# Patient Record
Sex: Female | Born: 2000 | Race: Black or African American | Hispanic: No | Marital: Single | Smoking: Never smoker
Health system: Southern US, Community
[De-identification: ages and names within clinical notes are randomized; demographics above are authoritative.]

## PROBLEM LIST (undated history)

## (undated) DIAGNOSIS — L309 Dermatitis, unspecified: Secondary | ICD-10-CM

## (undated) DIAGNOSIS — W3400XA Accidental discharge from unspecified firearms or gun, initial encounter: Secondary | ICD-10-CM

## (undated) DIAGNOSIS — Z789 Other specified health status: Secondary | ICD-10-CM

## (undated) HISTORY — DX: Dermatitis, unspecified: L30.9

## (undated) HISTORY — DX: Other specified health status: Z78.9

## (undated) HISTORY — PX: OTHER SURGICAL HISTORY: SHX169

## (undated) HISTORY — PX: NO PAST SURGERIES: SHX2092

---
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
## 2021-11-20 ENCOUNTER — Emergency Department
Admission: EM | Admit: 2021-11-20 | Discharge: 2021-11-20 | Disposition: A | Discharge status: Home / Self Care | Payer: Medicaid Other | Attending: Emergency Medicine | Admitting: Emergency Medicine

## 2021-11-20 ENCOUNTER — Other Ambulatory Visit: Payer: Self-pay

## 2021-11-20 ENCOUNTER — Encounter: Payer: Self-pay | Admitting: Emergency Medicine

## 2021-11-20 DIAGNOSIS — R220 Localized swelling, mass and lump, head: Secondary | ICD-10-CM | POA: Diagnosis not present

## 2021-11-20 MED ORDER — DIPHENHYDRAMINE HCL 50 MG PO TABS: 50.0000 mg | ORAL_TABLET | Freq: Three times a day (TID) | ORAL | 0 refills | Status: DC | PRN

## 2021-11-20 MED ORDER — LIDOCAINE-EPINEPHRINE 2 %-1:100000 IJ SOLN: 1.7000 mL | Freq: Once | INTRAMUSCULAR | Status: DC

## 2021-11-20 MED ORDER — LIDOCAINE-EPINEPHRINE 2 %-1:100000 IJ SOLN: 3.4000 mL | Freq: Once | INTRAMUSCULAR | Status: DC

## 2021-11-20 MED ORDER — PREDNISONE 50 MG PO TABS: 50.0000 mg | ORAL_TABLET | Freq: Every day | ORAL | 0 refills | Status: AC

## 2021-11-20 MED ORDER — LIDOCAINE VISCOUS HCL 2 % MT SOLN
15.0000 mL | Freq: Once | OROMUCOSAL | Status: DC
  Filled 2021-11-20: qty 15

## 2021-11-20 NOTE — ED Triage Notes (Signed)
Pt reports got her tongue pierced 7 months ago and was fine until 3 days ago and she ate something and now it is swollen and painful and she wants it removed.

## 2021-11-20 NOTE — ED Provider Notes (Signed)
[facility] Provider Note    None    (approximate)   History   Chief Complaint Foreign Body   HPI Rebekah Gray is a 21 y.o. female, no significant medical history, presents emergency department for evaluation of tongue swelling.  Patient states that she ate a new kind of Cheetos 3 days ago and since then has had swelling in her tongue.  She has a tongue piercing in place and is concerned that swelling will cause the piercing to get lost in the tissue.  She is requesting that the piercing be removed, as she cannot do it herself.  Denies fever/chills, chest pain, shortness of breath, dysphagia, abdominal pain, flank pain, nausea/vomiting, dizziness/lightheadedness, rash/lesions, or numb/tingling upper or lower extremities.  History Limitations: No limitations.        Physical Exam  Triage Vital Signs: ED Triage Vitals [11/20/21 1523]  Enc Vitals Group     BP      Pulse      Resp      Temp      Temp src      SpO2      Weight 128 lb 4.9 oz (58.2 kg)     Height 5\' 2"  (1.575 m)     Head Circumference      Peak Flow      Pain Score 10     Pain Loc      Pain Edu?      Excl. in [place]?     Most recent vital signs: There were no vitals filed for this visit.  General: Awake, NAD.  Skin: Warm, dry. No rashes or lesions.  Eyes: PERRL. Conjunctivae normal.  CV: Good peripheral perfusion.  Resp: Normal effort.  Abd: Soft, non-tender. No distention.  Neuro: At baseline. No gross neurological deficits.  Musculoskeletal: Normal ROM of all extremities.  Focused Exam: Piercing in place along the distal aspect of the tongue.  Mild swelling along the tongue, but otherwise no oropharyngeal edema or angioedema present.  No significant erythema.  No discharge.  Physical Exam    ED Results / Procedures / Treatments  Labs (all labs ordered are listed, but only abnormal results are displayed) Labs Reviewed - No data to  display   EKG N/A.    RADIOLOGY  ED Provider Interpretation: N/A.  No results found.  PROCEDURES:  Critical Care performed: N/A.  Procedures    MEDICATIONS ORDERED IN ED: Medications  lidocaine (XYLOCAINE) 2 % viscous mouth solution 15 mL (has no administration in time range)  lidocaine-EPINEPHrine (XYLOCAINE W/EPI) 2 %-1:100000 (with pres) injection 1.7 mL (has no administration in time range)     IMPRESSION / MDM / ASSESSMENT AND PLAN / ED COURSE  I reviewed the triage vital signs and the nursing notes.                              Differential diagnosis includes, but is not limited to, allergic reaction, angioedema, tongue swelling, glossitis, foreign body.  Assessment/Plan Patient presents with tongue swelling and request for removal of piercing.  Attempted manual removal of the piercing, however patient was not tolerating the procedure well due to pain/anxiety.  Trialed topical lidocaine along the tongue as well, however patient was still able to tolerate removal.  Offered to do inferior alveolar injections to provide anesthesia of the so she was tolerated the removal, however patient states that she is uncomfortable with the idea of  having injections.  She does state that her swelling has overall improved over the past 48 hours and believes that her tongue is shrinking gradually.  If that is the case, I do not see reason to remove the piercing at this time.  We will provide her with prescriptions for diphenhydramine and prednisone to treat this suspected allergic reaction.  Advised her to return to the emergency department if she begins to sign any signs of infection, including significant erythema, purulent discharge, or fevers.  She expressed understanding and agreed.  Will discharge.  Provided the patient with anticipatory guidance, return precautions, and educational material. Encouraged the patient to return to the emergency department at any time if they begin to  experience any new or worsening symptoms. Patient expressed understanding and agreed with the plan.   Patient's presentation is most consistent with acute, uncomplicated illness.       FINAL CLINICAL IMPRESSION(S) / ED DIAGNOSES   Final diagnoses:  Tongue swelling     Rx / DC Orders   ED Discharge Orders          Ordered    predniSONE (DELTASONE) 50 MG tablet  Daily with breakfast        11/20/21 1631    diphenhydrAMINE (BENADRYL) 50 MG tablet  Every 8 hours PRN        11/20/21 1631             Note:  This document was prepared using Dragon voice recognition software and may include unintentional dictation errors.   Varney Daily, [place] 11/20/21 1910    Corena Herter, MD 11/20/21 2001

## 2021-11-20 NOTE — Discharge Instructions (Addendum)
-  Please take the benadryl and the prednisone as prescribed.  -If the swelling does not improve or you begin to experience any new or worsening symptoms, please return promptly to the emergency department.

## 2021-11-21 ENCOUNTER — Ambulatory Visit (INDEPENDENT_AMBULATORY_CARE_PROVIDER_SITE_OTHER): Payer: 59 | Admitting: Family Medicine

## 2021-11-21 ENCOUNTER — Encounter: Payer: Self-pay | Admitting: Family Medicine

## 2021-11-21 VITALS — BP 118/73 | HR 89 | Temp 98.3°F | Resp 16 | Ht 62.0 in | Wt 122.0 lb

## 2021-11-21 DIAGNOSIS — G8929 Other chronic pain: Secondary | ICD-10-CM | POA: Diagnosis not present

## 2021-11-21 DIAGNOSIS — Z0000 Encounter for general adult medical examination without abnormal findings: Secondary | ICD-10-CM | POA: Diagnosis not present

## 2021-11-21 DIAGNOSIS — Z114 Encounter for screening for human immunodeficiency virus [HIV]: Secondary | ICD-10-CM | POA: Insufficient documentation

## 2021-11-21 DIAGNOSIS — Z1159 Encounter for screening for other viral diseases: Secondary | ICD-10-CM | POA: Diagnosis not present

## 2021-11-21 DIAGNOSIS — M542 Cervicalgia: Secondary | ICD-10-CM | POA: Diagnosis not present

## 2021-11-21 DIAGNOSIS — Z3009 Encounter for other general counseling and advice on contraception: Secondary | ICD-10-CM | POA: Insufficient documentation

## 2021-11-21 NOTE — Assessment & Plan Note (Signed)
Chronic cervical neck pain; denies radiculopathy down arms Encouraged to use OTC medication to assist NSAIDS and APAP
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
## 2021-11-21 NOTE — Assessment & Plan Note (Signed)
Low risk screen Treatable, and curable. If left untreated Hep C can lead to cirrhosis and liver failure. Encourage routine testing; recommend repeat testing if risk factors change.  

## 2021-11-21 NOTE — Assessment & Plan Note (Signed)
Low risk screen; encouraged to "know your status" Recommend repeat screen if risk factors change  
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
## 2021-11-21 NOTE — Patient Instructions (Signed)
Please come back to office for routine STI testings following completion of your menses.

## 2021-11-23 NOTE — Progress Notes (Signed)
Cell counts continues to show slight anemia; however, no infection.  Blood chemistry is pending.  Gwyneth Sprout, [address] [place], [place] [address] [phone] (phone) [phone] (fax) [place]

## 2021-11-24 LAB — CBC WITH DIFFERENTIAL/PLATELET
Basophils Absolute: 0.1 10*3/uL (ref 0.0–0.2)
Basos: 1 %
EOS (ABSOLUTE): 0.1 10*3/uL (ref 0.0–0.4)
Eos: 1 %
Hematocrit: 33.9 % — ABNORMAL LOW (ref 34.0–46.6)
Hemoglobin: 11.4 g/dL (ref 11.1–15.9)
Immature Grans (Abs): 0 10*3/uL (ref 0.0–0.1)
Immature Granulocytes: 0 %
Lymphocytes Absolute: 3.4 10*3/uL — ABNORMAL HIGH (ref 0.7–3.1)
Lymphs: 35 %
MCH: 31.2 pg (ref 26.6–33.0)
MCHC: 33.6 g/dL (ref 31.5–35.7)
MCV: 93 fL (ref 79–97)
Monocytes Absolute: 0.8 10*3/uL (ref 0.1–0.9)
Monocytes: 9 %
Neutrophils Absolute: 5.2 10*3/uL (ref 1.4–7.0)
Neutrophils: 54 %
Platelets: 435 10*3/uL (ref 150–450)
RBC: 3.65 x10E6/uL — ABNORMAL LOW (ref 3.77–5.28)
RDW: 11.5 % — ABNORMAL LOW (ref 11.7–15.4)
WBC: 9.7 10*3/uL (ref 3.4–10.8)

## 2021-11-24 LAB — COMPREHENSIVE METABOLIC PANEL
ALT: 10 IU/L (ref 0–32)
AST: 14 IU/L (ref 0–40)
Albumin/Globulin Ratio: 1.4 (ref 1.2–2.2)
Albumin: 4.9 g/dL (ref 4.0–5.0)
Alkaline Phosphatase: 91 IU/L (ref 42–106)
BUN/Creatinine Ratio: 10 (ref 9–23)
BUN: 8 mg/dL (ref 6–20)
Bilirubin Total: 0.2 mg/dL (ref 0.0–1.2)
CO2: 18 mmol/L — ABNORMAL LOW (ref 20–29)
Calcium: 10.3 mg/dL — ABNORMAL HIGH (ref 8.7–10.2)
Chloride: 102 mmol/L (ref 96–106)
Creatinine, Ser: 0.83 mg/dL (ref 0.57–1.00)
Globulin, Total: 3.6 g/dL (ref 1.5–4.5)
Glucose: 78 mg/dL (ref 70–99)
Potassium: 4.2 mmol/L (ref 3.5–5.2)
Sodium: 141 mmol/L (ref 134–144)
Total Protein: 8.5 g/dL (ref 6.0–8.5)
eGFR: 103 mL/min/{1.73_m2} (ref >59)

## 2021-11-24 LAB — HIV ANTIBODY (ROUTINE TESTING W REFLEX): HIV Screen 4th Generation wRfx: NONREACTIVE

## 2021-11-24 LAB — HEPATITIS C ANTIBODY: Hep C Virus Ab: NONREACTIVE

## 2021-11-24 NOTE — Progress Notes (Signed)
Chem is stable.  Daneil Dan FNP
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
## 2022-04-14 ENCOUNTER — Ambulatory Visit (LOCAL_COMMUNITY_HEALTH_CENTER): Payer: Medicaid Other

## 2022-04-14 DIAGNOSIS — Z111 Encounter for screening for respiratory tuberculosis: Secondary | ICD-10-CM

## 2022-04-17 ENCOUNTER — Ambulatory Visit (LOCAL_COMMUNITY_HEALTH_CENTER): Payer: Medicaid Other

## 2022-04-17 DIAGNOSIS — Z111 Encounter for screening for respiratory tuberculosis: Secondary | ICD-10-CM

## 2022-04-17 LAB — TB SKIN TEST
Induration: 0 mm
TB Skin Test: NEGATIVE

## 2022-07-31 ENCOUNTER — Emergency Department
Admission: EM | Admit: 2022-07-31 | Discharge: 2022-07-31 | Disposition: A | Discharge status: Home / Self Care | Payer: 59 | Attending: Emergency Medicine | Admitting: Emergency Medicine

## 2022-07-31 ENCOUNTER — Other Ambulatory Visit: Payer: Self-pay

## 2022-07-31 DIAGNOSIS — S0991XA Unspecified injury of ear, initial encounter: Secondary | ICD-10-CM | POA: Diagnosis not present

## 2022-07-31 DIAGNOSIS — X58XXXA Exposure to other specified factors, initial encounter: Secondary | ICD-10-CM | POA: Diagnosis not present
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
## 2022-08-17 ENCOUNTER — Ambulatory Visit (INDEPENDENT_AMBULATORY_CARE_PROVIDER_SITE_OTHER): Payer: Self-pay | Admitting: Family Medicine

## 2022-08-17 DIAGNOSIS — Z91199 Patient's noncompliance with other medical treatment and regimen due to unspecified reason: Secondary | ICD-10-CM

## 2022-08-17 NOTE — Progress Notes (Signed)
Patient was not seen for appt d/t no call, no show, or late arrival >10 mins past appt time.   Rexton Greulich T Jessieca Rhem, FNP  [facility] [address] [place], [place] [address] [phone] (phone) [phone] (fax)  Medical Group  

## 2022-12-01 ENCOUNTER — Emergency Department: Payer: 59

## 2022-12-01 ENCOUNTER — Emergency Department
Admission: EM | Admit: 2022-12-01 | Discharge: 2022-12-01 | Disposition: A | Discharge status: Home / Self Care | Payer: 59 | Attending: Emergency Medicine | Admitting: Emergency Medicine

## 2022-12-01 ENCOUNTER — Ambulatory Visit: Payer: Self-pay | Admitting: *Deleted

## 2022-12-01 ENCOUNTER — Other Ambulatory Visit: Payer: Self-pay

## 2022-12-01 DIAGNOSIS — R0602 Shortness of breath: Secondary | ICD-10-CM | POA: Diagnosis not present

## 2022-12-01 DIAGNOSIS — Z20822 Contact with and (suspected) exposure to covid-19: Secondary | ICD-10-CM | POA: Diagnosis not present

## 2022-12-01 DIAGNOSIS — J0100 Acute maxillary sinusitis, unspecified: Secondary | ICD-10-CM | POA: Insufficient documentation

## 2022-12-01 DIAGNOSIS — R059 Cough, unspecified: Secondary | ICD-10-CM | POA: Diagnosis not present

## 2022-12-01 DIAGNOSIS — R519 Headache, unspecified: Secondary | ICD-10-CM | POA: Diagnosis not present

## 2022-12-01 DIAGNOSIS — J069 Acute upper respiratory infection, unspecified: Secondary | ICD-10-CM | POA: Insufficient documentation

## 2022-12-01 DIAGNOSIS — R1110 Vomiting, unspecified: Secondary | ICD-10-CM | POA: Diagnosis not present

## 2022-12-01 LAB — URINALYSIS, ROUTINE W REFLEX MICROSCOPIC
Bilirubin Urine: NEGATIVE
Glucose, UA: NEGATIVE mg/dL
Hgb urine dipstick: NEGATIVE
Ketones, ur: 20 mg/dL — AB
Leukocytes,Ua: NEGATIVE
Nitrite: NEGATIVE
Protein, ur: NEGATIVE mg/dL
Specific Gravity, Urine: 1.025 (ref 1.005–1.030)
pH: 5 (ref 5.0–8.0)

## 2022-12-01 LAB — COMPREHENSIVE METABOLIC PANEL
ALT: 17 U/L (ref 0–44)
AST: 19 U/L (ref 15–41)
Albumin: 4.3 g/dL (ref 3.5–5.0)
Alkaline Phosphatase: 71 U/L (ref 38–126)
Anion gap: 10 (ref 5–15)
BUN: 7 mg/dL (ref 6–20)
CO2: 26 mmol/L (ref 22–32)
Calcium: 9.3 mg/dL (ref 8.9–10.3)
Chloride: 101 mmol/L (ref 98–111)
Creatinine, Ser: 0.68 mg/dL (ref 0.44–1.00)
GFR, Estimated: >60 mL/min (ref >60)
Glucose, Bld: 91 mg/dL (ref 70–99)
Potassium: 3.4 mmol/L — ABNORMAL LOW (ref 3.5–5.1)
Sodium: 137 mmol/L (ref 135–145)
Total Bilirubin: 0.6 mg/dL (ref 0.3–1.2)
Total Protein: 8.9 g/dL — ABNORMAL HIGH (ref 6.5–8.1)

## 2022-12-01 LAB — LIPASE, BLOOD: Lipase: 53 U/L — ABNORMAL HIGH (ref 11–51)

## 2022-12-01 LAB — CBC
HCT: 34.5 % — ABNORMAL LOW (ref 36.0–46.0)
Hemoglobin: 11.4 g/dL — ABNORMAL LOW (ref 12.0–15.0)
MCH: 29.5 pg (ref 26.0–34.0)
MCHC: 33 g/dL (ref 30.0–36.0)
MCV: 89.1 fL (ref 80.0–100.0)
Platelets: 429 10*3/uL — ABNORMAL HIGH (ref 150–400)
RBC: 3.87 MIL/uL (ref 3.87–5.11)
RDW: 13 % (ref 11.5–15.5)
WBC: 13.2 10*3/uL — ABNORMAL HIGH (ref 4.0–10.5)
nRBC: 0 % (ref 0.0–0.2)

## 2022-12-01 LAB — POC URINE PREG, ED: Preg Test, Ur: NEGATIVE

## 2022-12-01 LAB — SARS CORONAVIRUS 2 BY RT PCR: SARS Coronavirus 2 by RT PCR: NEGATIVE

## 2022-12-01 MED ORDER — KETOROLAC TROMETHAMINE 30 MG/ML IJ SOLN
15.0000 mg | Freq: Once | INTRAMUSCULAR | Status: AC
  Administered 2022-12-01: 15 mg via INTRAVENOUS
  Filled 2022-12-01: qty 1

## 2022-12-01 MED ORDER — ONDANSETRON HCL 4 MG/2ML IJ SOLN
4.0000 mg | Freq: Once | INTRAMUSCULAR | Status: AC
  Administered 2022-12-01: 4 mg via INTRAVENOUS
  Filled 2022-12-01: qty 2

## 2022-12-01 MED ORDER — AMOXICILLIN-POT CLAVULANATE 875-125 MG PO TABS: 1.0000 | ORAL_TABLET | Freq: Two times a day (BID) | ORAL | 0 refills | Status: AC

## 2022-12-01 MED ORDER — ONDANSETRON 4 MG PO TBDP: 4.0000 mg | ORAL_TABLET | Freq: Three times a day (TID) | ORAL | 0 refills | Status: DC | PRN

## 2022-12-01 MED ORDER — DEXAMETHASONE SODIUM PHOSPHATE 10 MG/ML IJ SOLN
10.0000 mg | Freq: Once | INTRAMUSCULAR | Status: AC
  Administered 2022-12-01: 10 mg via INTRAVENOUS
  Filled 2022-12-01: qty 1

## 2022-12-01 MED ORDER — LACTATED RINGERS IV BOLUS
1000.0000 mL | Freq: Once | INTRAVENOUS | Status: AC
  Administered 2022-12-01: 1000 mL via INTRAVENOUS

## 2022-12-01 MED ORDER — DIPHENHYDRAMINE HCL 50 MG/ML IJ SOLN: 25.0000 mg | Freq: Once | INTRAMUSCULAR | Status: DC

## 2022-12-01 MED ORDER — BUTALBITAL-APAP-CAFFEINE 50-325-40 MG PO TABS
1.0000 | ORAL_TABLET | Freq: Four times a day (QID) | ORAL | 0 refills | Status: AC | PRN
Start: 2022-12-01 — End: 2023-12-01

## 2022-12-01 MED ORDER — PROCHLORPERAZINE EDISYLATE 10 MG/2ML IJ SOLN: 10.0000 mg | Freq: Once | INTRAMUSCULAR | Status: DC

## 2022-12-01 MED ORDER — ALBUTEROL SULFATE HFA 108 (90 BASE) MCG/ACT IN AERS: 2.0000 | INHALATION_SPRAY | RESPIRATORY_TRACT | 0 refills | Status: AC | PRN

## 2022-12-01 MED ORDER — ACETAMINOPHEN 500 MG PO TABS
1000.0000 mg | ORAL_TABLET | Freq: Once | ORAL | Status: AC
Start: 2022-12-01 — End: 2022-12-01
  Administered 2022-12-01: 1000 mg via ORAL
  Filled 2022-12-01: qty 2

## 2022-12-01 NOTE — ED Provider Notes (Signed)
[facility] Provider Note    Event Date/Time   First MD Initiated Contact with Patient 12/01/22 1203     (approximate)   History   Headache and Vomiting   HPI  Rebekah Gray is a 22 y.o. female  here with headache, nausea, sore throat. Pt reports that over the past week she has had sore throat, sinus congestion, subjective chills, cough. She has had nausea and has been coughing up yellow-green sputum. No diarrhea. No rash. States she has had an aching, throbbing headache as well that is worse with leaning forward. No specific alleviating factors. No known sick contacts.      Physical Exam   Triage Vital Signs: ED Triage Vitals  Encounter Vitals Group     BP 12/01/22 1126 128/80     Systolic BP Percentile --      Diastolic BP Percentile --      Pulse Rate 12/01/22 1126 99     Resp 12/01/22 1126 17     Temp 12/01/22 1126 98.8 F (37.1 C)     Temp Source 12/01/22 1126 Oral     SpO2 12/01/22 1126 99 %     Weight 12/01/22 1127 138 lb (62.6 kg)     Height 12/01/22 1127 5\' 2"  (1.575 m)     Head Circumference --      Peak Flow --      Pain Score 12/01/22 1127 10     Pain Loc --      Pain Education --      Exclude from Growth Chart --     Most recent vital signs: Vitals:   12/01/22 1550 12/01/22 1553  BP:  138/71  Pulse:  (!) 106  Resp:  16  Temp: 99.5 F (37.5 C)   SpO2:  100%     General: Awake, no distress.  CV:  Good peripheral perfusion. RRR.  Resp:  Normal work of breathing. Lungs clear bilaterally.  Abd:  No distention. No tenderness. No guarding or rebound. Other:  Moderate nasal congestion and bilateral rhinorrhea. Posterior pharyngeal erythema and tonsillar swelling w/o exudates. Tongue normal. Normal phonation.   ED Results / Procedures / Treatments   Labs (all labs ordered are listed, but only abnormal results are displayed) Labs Reviewed  LIPASE, BLOOD - Abnormal; Notable for the following components:      Result  Value   Lipase 53 (*)    All other components within normal limits  COMPREHENSIVE METABOLIC PANEL - Abnormal; Notable for the following components:   Potassium 3.4 (*)    Total Protein 8.9 (*)    All other components within normal limits  CBC - Abnormal; Notable for the following components:   WBC 13.2 (*)    Hemoglobin 11.4 (*)    HCT 34.5 (*)    Platelets 429 (*)    All other components within normal limits  URINALYSIS, ROUTINE W REFLEX MICROSCOPIC - Abnormal; Notable for the following components:   Color, Urine YELLOW (*)    APPearance CLOUDY (*)    Ketones, ur 20 (*)    All other components within normal limits  SARS CORONAVIRUS 2 BY RT PCR  POC URINE PREG, ED     EKG    RADIOLOGY CXR: Clear, no acute proces   I also independently reviewed and agree with radiologist interpretations.   PROCEDURES:  Critical Care performed: No   MEDICATIONS ORDERED IN ED: Medications  lactated ringers bolus 1,000 mL (0 mLs Intravenous Stopped  12/01/22 1536)  ondansetron (ZOFRAN) injection 4 mg (4 mg Intravenous Given 12/01/22 1248)  ketorolac (TORADOL) 30 MG/ML injection 15 mg (15 mg Intravenous Given 12/01/22 1248)  acetaminophen (TYLENOL) tablet 1,000 mg (1,000 mg Oral Given 12/01/22 1555)  dexamethasone (DECADRON) injection 10 mg (10 mg Intravenous Given 12/01/22 1557)     IMPRESSION / MDM / ASSESSMENT AND PLAN / ED COURSE  I reviewed the triage vital signs and the nursing notes.                              Differential diagnosis includes, but is not limited to, URI, sinusitis, PNA, bronchitis, pharyngitis, COVID-19  Patient's presentation is most consistent with acute presentation with potential threat to life or bodily function.  The patient is on the cardiac monitor to evaluate for evidence of arrhythmia and/or significant heart rate changes  22 yo F here with  nausea, headache, sinus pressure, cough. Suspect viral URI/bronchitis and sinusitis, with subsequent  general fatigue and dehydration. VS are stable. Satting well on RA. Lab work obtained, reviewed, and is overall reassuring. Mild leukocytosis of 13k likely reactive. No VS abnormalitie sto suggest sepsis. CXR is clear. CMP unremarkable with normal renal function. Lipase minimally/trivially elevated - no epigastric pain or signs of pancreatitis. LFT sand bili are normal. UPT neg. COVID pending. Pt feels better with IVF. Her neuro exam is nonfocal, and she has no meningismus or signs to suggest meningitis, encephalitis.  Will tx with meds for sinusitis, HA, nausea, and supportive care with encouraged hydration at home.   FINAL CLINICAL IMPRESSION(S) / ED DIAGNOSES   Final diagnoses:  Acute non-recurrent maxillary sinusitis  Upper respiratory tract infection, unspecified type     Rx / DC Orders   ED Discharge Orders          Ordered    amoxicillin-clavulanate (AUGMENTIN) 875-125 MG tablet  2 times daily        12/01/22 1558    ondansetron (ZOFRAN-ODT) 4 MG disintegrating tablet  Every 8 hours PRN        12/01/22 1558    butalbital-acetaminophen-caffeine (FIORICET) 50-325-40 MG tablet  Every 6 hours PRN        12/01/22 1558             Note:  This document was prepared using Dragon voice recognition software and may include unintentional dictation errors.   Shaune Pollack, MD 12/01/22 [phone]

## 2022-12-01 NOTE — Telephone Encounter (Signed)
  Chief Complaint: vomiting Symptoms: vomiting, congestion, headache Frequency: symptoms started 4-5 days ago Pertinent Negatives: Patient denies abdominal pain, diarrhea Disposition: [x] ED /[] Urgent Care (no appt availability in office) / [] Appointment(In office/virtual)/ []  [facility]/ [] Home Care/ [] Refused Recommended Disposition /[] [facility]/ []  Follow-up with PCP Additional Notes: Patient states she is unable to eat and can only tolerate sips. Patient has dry mouth and last urinated yesterday pm. Advised per protocol- ED- dehydration risk

## 2022-12-01 NOTE — ED Notes (Signed)
Pt given UA cup and instructed to give sample- pt verbalizes understanding, states she cannot urinate at this time.

## 2022-12-01 NOTE — Telephone Encounter (Signed)
Reason for Disposition  [1] Drinking very little AND [2] dehydration suspected (e.g., no urine > 12 hours, very dry mouth, very lightheaded)  Answer Assessment - Initial Assessment Questions 1. VOMITING SEVERITY: "How many times have you vomited in the past 24 hours?"     - MILD:  1 - 2 times/day    - MODERATE: 3 - 5 times/day, decreased oral intake without significant weight loss or symptoms of dehydration    - SEVERE: 6 or more times/day, vomits everything or nearly everything, with significant weight loss, symptoms of dehydration      mild 2. ONSET: "When did the vomiting begin?"      3 days ago- patient states she has not eaten in 4 days 3. FLUIDS: "What fluids or food have you vomited up today?" "Have you been able to keep any fluids down?"     Only drink sips- not able to keep more down  4. ABDOMEN PAIN: "Are your having any abdomen pain?" If Yes : "How bad is it and what does it feel like?" (e.g., crampy, dull, intermittent, constant)      no 5. DIARRHEA: "Is there any diarrhea?" If Yes, ask: "How many times today?"      no 6. CONTACTS: "Is there anyone else in the family with the same symptoms?"      no 7. CAUSE: "What do you think is causing your vomiting?"     Unsure-weather change 8. HYDRATION STATUS: "Any signs of dehydration?" (e.g., dry mouth [not only dry lips], too weak to stand) "When did you last urinate?"     Dry mouth, last night 9. OTHER SYMPTOMS: "Do you have any other symptoms?" (e.g., fever, headache, vertigo, vomiting blood or coffee grounds, recent head injury)     Headache, congestion 10. PREGNANCY: "Is there any chance you are pregnant?" "When was your last menstrual period?"       No- LMP- irregular- Nexplanon  Protocols used: Vomiting-A-AH

## 2022-12-01 NOTE — ED Triage Notes (Signed)
Pt sts that she has been having a headache and been vomiting. Pt sts that she has not been able to keep food down in four days.

## 2022-12-01 NOTE — Telephone Encounter (Signed)
Patient went to the ED

## 2023-03-25 ENCOUNTER — Other Ambulatory Visit: Payer: Self-pay

## 2023-03-25 DIAGNOSIS — R079 Chest pain, unspecified: Secondary | ICD-10-CM | POA: Diagnosis not present

## 2023-03-25 DIAGNOSIS — Z5321 Procedure and treatment not carried out due to patient leaving prior to being seen by health care provider: Secondary | ICD-10-CM | POA: Diagnosis not present

## 2023-03-25 NOTE — ED Triage Notes (Addendum)
Pt to ed from work for CP that started yesterday morning. Pt continued to work. Pt is caox4, in no acute distress and  ambulatory in triage. Pt has diagnosed HX of anxiety. Pt has pain during inspiration in the center of her chest

## 2023-03-26 ENCOUNTER — Emergency Department: Payer: 59

## 2023-03-26 ENCOUNTER — Emergency Department
Admission: EM | Admit: 2023-03-26 | Discharge: 2023-03-26 | Discharge status: Against Medical Advice | Payer: 59 | Attending: Emergency Medicine | Admitting: Emergency Medicine

## 2023-03-26 DIAGNOSIS — R079 Chest pain, unspecified: Secondary | ICD-10-CM | POA: Diagnosis not present

## 2023-03-26 LAB — CBC
HCT: 30.6 % — ABNORMAL LOW (ref 36.0–46.0)
Hemoglobin: 9.8 g/dL — ABNORMAL LOW (ref 12.0–15.0)
MCH: 29.5 pg (ref 26.0–34.0)
MCHC: 32 g/dL (ref 30.0–36.0)
MCV: 92.2 fL (ref 80.0–100.0)
Platelets: 417 10*3/uL — ABNORMAL HIGH (ref 150–400)
RBC: 3.32 MIL/uL — ABNORMAL LOW (ref 3.87–5.11)
RDW: 13.3 % (ref 11.5–15.5)
WBC: 8.4 10*3/uL (ref 4.0–10.5)
nRBC: 0 % (ref 0.0–0.2)

## 2023-03-26 LAB — BASIC METABOLIC PANEL
Anion gap: 11 (ref 5–15)
BUN: 11 mg/dL (ref 6–20)
CO2: 25 mmol/L (ref 22–32)
Calcium: 9.4 mg/dL (ref 8.9–10.3)
Chloride: 102 mmol/L (ref 98–111)
Creatinine, Ser: 0.63 mg/dL (ref 0.44–1.00)
GFR, Estimated: >60 mL/min (ref >60)
Glucose, Bld: 102 mg/dL — ABNORMAL HIGH (ref 70–99)
Potassium: 3.4 mmol/L — ABNORMAL LOW (ref 3.5–5.1)
Sodium: 138 mmol/L (ref 135–145)

## 2023-03-26 LAB — TROPONIN I (HIGH SENSITIVITY)
Troponin I (High Sensitivity): <2 ng/L (ref <18)
Troponin I (High Sensitivity): <2 ng/L (ref <18)

## 2023-03-26 LAB — POC URINE PREG, ED: Preg Test, Ur: NEGATIVE

## 2023-03-26 NOTE — ED Notes (Signed)
 Urine sent to lab

## 2023-04-02 ENCOUNTER — Ambulatory Visit: Payer: 59 | Admitting: Family Medicine

## 2023-04-09 ENCOUNTER — Encounter: Payer: Self-pay | Admitting: Family Medicine

## 2023-04-09 ENCOUNTER — Ambulatory Visit (INDEPENDENT_AMBULATORY_CARE_PROVIDER_SITE_OTHER): Payer: 59 | Admitting: Family Medicine

## 2023-04-09 VITALS — BP 127/68 | HR 85 | Ht 62.0 in | Wt 131.5 lb

## 2023-04-09 DIAGNOSIS — Z5320 Procedure and treatment not carried out because of patient's decision for unspecified reasons: Secondary | ICD-10-CM | POA: Insufficient documentation

## 2023-04-09 DIAGNOSIS — Z87898 Personal history of other specified conditions: Secondary | ICD-10-CM

## 2023-04-09 DIAGNOSIS — Z87828 Personal history of other (healed) physical injury and trauma: Secondary | ICD-10-CM | POA: Diagnosis not present

## 2023-04-09 DIAGNOSIS — Z3009 Encounter for other general counseling and advice on contraception: Secondary | ICD-10-CM | POA: Diagnosis not present

## 2023-04-09 DIAGNOSIS — R030 Elevated blood-pressure reading, without diagnosis of hypertension: Secondary | ICD-10-CM

## 2023-04-09 DIAGNOSIS — L309 Dermatitis, unspecified: Secondary | ICD-10-CM | POA: Diagnosis not present

## 2023-04-09 MED ORDER — TRIAMCINOLONE ACETONIDE 0.1 % EX CREA: TOPICAL_CREAM | CUTANEOUS | 6 refills | Status: AC

## 2023-04-09 NOTE — Assessment & Plan Note (Signed)
 Pt agreeable to getting pap - but would like to hold off until next appt when she removes nexplanon Will plan STI screening then as well

## 2023-04-09 NOTE — Assessment & Plan Note (Addendum)
 Was seen in ED for chest tightness Pt left AMA due to time at ED appears to have been related to respiratory virus at this time Chronic anemia present - most likely related to heavy periods Pt declines any issues or concerns today regard chest tightness, states completely resolves Heart sounds and breath sounds unremarkable during visit today. Recommend repeat of cbc at next office vist
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
## 2023-04-09 NOTE — Assessment & Plan Note (Addendum)
 Nexplanon in place Increased irregular periods, states super heavy one month and other times she barely has period.  Becoming frustrated with nexplanon - no longer would like to have Pt would like LARC removed in 2-3 months Discussed efficiency of nexplanon at 2.5 years  Offered information on other birth control options - pt declined Agreeable to condoms for birth control as periods inconsistent, as well for STI prevention Recommend pt to schedule removal with MD at [facility]

## 2023-04-09 NOTE — Assessment & Plan Note (Signed)
 Elevated SBP Goal 119/79 Increase weekly exercise Decrease sodium intake Water drink of choice
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
## 2023-04-09 NOTE — Assessment & Plan Note (Signed)
 Chronic, stable Needing refill on triamcinolone

## 2023-07-10 ENCOUNTER — Ambulatory Visit: Admitting: Family Medicine
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
## 2023-07-12 ENCOUNTER — Ambulatory Visit (INDEPENDENT_AMBULATORY_CARE_PROVIDER_SITE_OTHER): Admitting: Family Medicine

## 2023-07-12 ENCOUNTER — Encounter: Payer: Self-pay | Admitting: Family Medicine

## 2023-07-12 VITALS — BP 121/73 | HR 88 | Ht 62.0 in | Wt 136.3 lb

## 2023-07-12 DIAGNOSIS — Z3046 Encounter for surveillance of implantable subdermal contraceptive: Secondary | ICD-10-CM
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
## 2023-07-24 ENCOUNTER — Encounter: Payer: Self-pay | Admitting: Family Medicine

## 2023-08-13 ENCOUNTER — Ambulatory Visit: Admitting: Family Medicine

## 2023-08-29 ENCOUNTER — Other Ambulatory Visit (HOSPITAL_COMMUNITY)
Admission: RE | Admit: 2023-08-29 | Discharge: 2023-08-29 | Disposition: A | Discharge status: Home / Self Care | Source: Ambulatory Visit | Attending: Family Medicine | Admitting: Family Medicine

## 2023-08-29 ENCOUNTER — Encounter: Payer: Self-pay | Admitting: Family Medicine

## 2023-08-29 ENCOUNTER — Ambulatory Visit (INDEPENDENT_AMBULATORY_CARE_PROVIDER_SITE_OTHER): Admitting: Family Medicine

## 2023-08-29 VITALS — BP 109/60 | HR 91 | Temp 98.3°F | Ht 62.0 in | Wt 137.1 lb

## 2023-08-29 DIAGNOSIS — Z6825 Body mass index (BMI) 25.0-25.9, adult: Secondary | ICD-10-CM | POA: Diagnosis not present

## 2023-08-29 DIAGNOSIS — Z3009 Encounter for other general counseling and advice on contraception: Secondary | ICD-10-CM

## 2023-08-29 DIAGNOSIS — Z124 Encounter for screening for malignant neoplasm of cervix: Secondary | ICD-10-CM | POA: Diagnosis not present

## 2023-08-29 DIAGNOSIS — Z113 Encounter for screening for infections with a predominantly sexual mode of transmission: Secondary | ICD-10-CM | POA: Insufficient documentation

## 2023-08-29 MED ORDER — DROSPIRENONE-ETHINYL ESTRADIOL 3-0.02 MG PO TABS: 1.0000 | ORAL_TABLET | Freq: Every day | ORAL | 11 refills | Status: AC

## 2023-08-29 NOTE — Progress Notes (Signed)
 Established Patient Office Visit  Introduced to nurse practitioner role and practice setting.  All questions answered.  Discussed provider/patient relationship and expectations.   Subjective   Patient ID: Rebekah Gray, female    DOB: 08-Aug-2000  Age: 23 y.o. MRN: [id number]  Chief Complaint  Patient presents with   Acute Visit    Wants a pap smear and blood work, discuss getting on the pills.  Chlamydia Screening- yes Cervical Screening- yes  Wants a refill on Kenalog  cream.    Discussed the use of AI scribe software for clinical note transcription with the patient, who gave verbal consent to proceed.  History of Present Illness Rebekah Gray is a 23 year old female who presents for a Pap smear, STI screening, and to discuss birth control options.  She is currently experiencing spotting, which is not heavy. Her last full menstrual period was last month, coinciding with the removal of her birth control implant on June 5th. She has not had a full period since then, and the spotting began yesterday.  She is interested in starting birth control pills, having previously had a birth control implant removed last month. She has never used birth control pills before and is considering the combination of estrogen and progesterone pills.  She wants to have her blood drawn to check for HIV and other STIs. She is not currently in a relationship, having recently ended one this month. No concerns for pregnancy, stating she does not think she is pregnant.  No history of pregnancies, although she mentions she might have been pregnant once. No breast concerns. Her tooth has been hurting. No leg warmth or other concerning symptoms.  She recently traveled to [place]  and plans to visit [place] next month for a friend's birthday. She also has plans for a lake party for her sister's birthday this weekend. 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         08/29/2023    9:27 AM  07/12/2023   11:07 AM 04/09/2023    1:23 PM  Depression screen PHQ 2/9  Decreased Interest 0 0 0  Down, Depressed, Hopeless 1 1 0  PHQ - 2 Score 1 1 0  Altered sleeping 1 0 0  Tired, decreased energy 2 2 0  Change in appetite 0 0 0  Feeling bad or failure about yourself  0 0 0  Trouble concentrating 0 1 0  Moving slowly or fidgety/restless 0 0 0  Suicidal thoughts 0 0 0  PHQ-9 Score 4 4 0  Difficult doing work/chores Not difficult at all Somewhat difficult Not difficult at all       08/29/2023    9:28 AM 07/12/2023   11:08 AM 04/09/2023    1:23 PM  GAD 7 : Generalized Anxiety Score  Nervous, Anxious, on Edge 1 2 2   Control/stop worrying 0 0 0  Worry too much - different things 2 2 2   Trouble relaxing 1 1 0  Restless 0 0 0  Easily annoyed or irritable 0 0 0  Afraid - awful might happen 0 0 0  Total GAD 7 Score 4 5 4   Anxiety Difficulty Not difficult at all  Somewhat difficult     Review of Systems  All other systems reviewed and are negative.   Negative unless indicated in HPI   Objective:     BP 109/60 (BP Location: Left Arm, Patient Position: Sitting, Cuff Size: Normal)   Pulse 91  Temp 98.3 F (36.8 C) (Oral)   Ht 5' 2 (1.575 m)   Wt 137 lb 1.6 oz (62.2 kg)   SpO2 100%   BMI 25.08 kg/m    Physical Exam Exam conducted with a chaperone present.  Constitutional:      General: She is not in acute distress.    Appearance: Normal appearance. She is normal weight. She is not ill-appearing, toxic-appearing or diaphoretic.  HENT:     Head: Normocephalic.     Nose: Nose normal.     Mouth/Throat:     Mouth: Mucous membranes are moist.     Pharynx: Oropharynx is clear.  Eyes:     Extraocular Movements: Extraocular movements intact.     Pupils: Pupils are equal, round, and reactive to light.  Cardiovascular:     Rate and Rhythm: Normal rate and regular rhythm.     Pulses: Normal pulses.     Heart sounds: Normal heart sounds. No murmur heard.    No friction rub.  No gallop.  Pulmonary:     Effort: No respiratory distress.     Breath sounds: No stridor. No wheezing, rhonchi or rales.  Chest:     Chest wall: No tenderness.  Abdominal:     Hernia: There is no hernia in the left inguinal area or right inguinal area.  Genitourinary:    General: Normal vulva.     Exam position: Lithotomy position.     Tanner stage (genital): 5.     Labia:        Right: No rash, tenderness, lesion or injury.        Left: No rash, tenderness, lesion or injury.      Urethra: No prolapse, urethral pain, urethral swelling or urethral lesion.     Vagina: No signs of injury. No vaginal discharge, erythema, tenderness, bleeding or prolapsed vaginal walls.     Cervix: Cervical bleeding present. No cervical motion tenderness, discharge, friability, lesion, erythema or eversion.     Uterus: Normal.      Rectum: Normal.     Comments: Unable to palpate adnexa. Light bleeding present, pt most likely near start of period. Swabbed away blood, before PAP. Pap preformed with speculum (small).  Musculoskeletal:     Right lower leg: No edema.     Left lower leg: No edema.  Lymphadenopathy:     Lower Body: No right inguinal adenopathy. No left inguinal adenopathy.  Skin:    General: Skin is warm and dry.     Capillary Refill: Capillary refill takes less than 2 seconds.  Neurological:     General: No focal deficit present.     Mental Status: She is alert and oriented to person, place, and time. Mental status is at baseline.     Cranial Nerves: No cranial nerve deficit.     Sensory: No sensory deficit.     Motor: No weakness.     Gait: Gait normal.  Psychiatric:        Mood and Affect: Mood normal.        Behavior: Behavior normal.        Thought Content: Thought content normal.        Judgment: Judgment normal.      No results found for any visits on 08/29/23.    The ASCVD Risk score (Arnett DK, et al., 2019) failed to calculate for the following reasons:   The 2019  ASCVD risk score is only valid for ages 13 to 9  Assessment & Plan:  Routine screening for STI (sexually transmitted infection) -     HIV Antibody (routine testing w rflx) -     RPR -     Cytology - PAP  Counseling for birth control, oral contraceptives -     Human Chorionic Gonadotropin  (hCG),Quantitative (Serial Monitor) -     Drospirenone -Ethinyl Estradiol ; Take 1 tablet by mouth daily.  Dispense: 28 tablet; Refill: 11  BMI 25.0-25.9,adult -     Comprehensive metabolic panel with GFR  Encounter for Papanicolaou smear for cervical cancer screening -     Cytology - PAP    Assessment and Plan Assessment & Plan Routine Pap Smear and STI Screening Pap smear performed for cervical cancer screening with Chaperone. STI screening includes chlamydia, gonorrhea, and trichomoniasis. Informed about procedure and potential discomfort.    External: normal female genitalia without lesions or masses        Vagina: normal without lesions or masses        Cervix: normal without lesions or masses        Pap smear: performed   - Screen for chlamydia, gonorrhea, and trichomoniasis. - Referral to OB/GYN if pap results abnormal  STI and HIV Screening Requested comprehensive STI screening post-relationship. No current STI symptoms. - Order HIV and RPR screening. - Order comprehensive metabolic panel (CMP) for kidney function.  Pregnancy Screening Spotting and recent contraceptive implant removal (nexplanon  on 07/12/23) warrant pregnancy test prior to starting OCPs. Has been sexually active without protection since nexplanon  removal - Order quantitative HCG blood test to rule out pregnancy.  Contraceptive Management Interested in oral contraceptive pills post-implant removal. Discussed options; combination pill chosen for dual mechanism. Advised on daily intake and STI prevention. - Given blood labs today, will draw hcg to confirm no pregnancy today - Prescribe Yaz (combination oral  contraceptive). - Advise daily administration at the same time for full effectiveness. - Recommend condom use for STI prevention.   Return for annual physical.   I, Curtis DELENA Boom, FNP, have reviewed all documentation for this visit. The documentation on 08/29/23 for the exam, diagnosis, procedures, and orders are all accurate and complete.   Curtis DELENA Boom, FNP

## 2023-08-30 ENCOUNTER — Ambulatory Visit: Payer: Self-pay | Admitting: Family Medicine

## 2023-08-30 DIAGNOSIS — A599 Trichomoniasis, unspecified: Secondary | ICD-10-CM

## 2023-08-30 DIAGNOSIS — A749 Chlamydial infection, unspecified: Secondary | ICD-10-CM

## 2023-08-30 DIAGNOSIS — R87610 Atypical squamous cells of undetermined significance on cytologic smear of cervix (ASC-US): Secondary | ICD-10-CM

## 2023-08-30 LAB — COMPREHENSIVE METABOLIC PANEL WITH GFR
ALT: 26 IU/L (ref 0–32)
AST: 24 IU/L (ref 0–40)
Albumin: 4.3 g/dL (ref 4.0–5.0)
Alkaline Phosphatase: 74 IU/L (ref 44–121)
BUN/Creatinine Ratio: 17 (ref 9–23)
BUN: 12 mg/dL (ref 6–20)
Bilirubin Total: <0.2 mg/dL (ref 0.0–1.2)
CO2: 20 mmol/L (ref 20–29)
Calcium: 9.4 mg/dL (ref 8.7–10.2)
Chloride: 105 mmol/L (ref 96–106)
Creatinine, Ser: 0.72 mg/dL (ref 0.57–1.00)
Globulin, Total: 3 g/dL (ref 1.5–4.5)
Glucose: 94 mg/dL (ref 70–99)
Potassium: 4.2 mmol/L (ref 3.5–5.2)
Sodium: 140 mmol/L (ref 134–144)
Total Protein: 7.3 g/dL (ref 6.0–8.5)
eGFR: 121 mL/min/1.73 (ref >59)

## 2023-08-30 LAB — HIV ANTIBODY (ROUTINE TESTING W REFLEX): HIV Screen 4th Generation wRfx: NONREACTIVE

## 2023-08-30 LAB — HUMAN CHORIONIC GONADOTROPIN(HCG),B-SUBUNIT,QUANTITATIVE): HCG, Beta Chain, Quant, S: <1 m[IU]/mL

## 2023-09-06 LAB — CYTOLOGY - PAP
Chlamydia: POSITIVE — AB
Comment: NEGATIVE
Comment: NEGATIVE
Comment: NEGATIVE
Comment: NORMAL
Diagnosis: UNDETERMINED — AB
High risk HPV: NEGATIVE
Neisseria Gonorrhea: NEGATIVE
Trichomonas: POSITIVE — AB

## 2023-09-06 MED ORDER — METRONIDAZOLE 500 MG PO TABS: 500.0000 mg | ORAL_TABLET | Freq: Two times a day (BID) | ORAL | 0 refills | Status: AC

## 2023-09-06 MED ORDER — DOXYCYCLINE HYCLATE 100 MG PO TABS: 100.0000 mg | ORAL_TABLET | Freq: Two times a day (BID) | ORAL | 0 refills | Status: AC

## 2023-09-07 NOTE — Progress Notes (Signed)
 Seen by patient Rebekah Gray on 09/07/2023 12:22 PM

## 2023-11-07 ENCOUNTER — Encounter: Admitting: Obstetrics and Gynecology
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
## 2023-12-11 ENCOUNTER — Encounter: Payer: Self-pay | Admitting: Obstetrics

## 2023-12-11 ENCOUNTER — Other Ambulatory Visit (HOSPITAL_COMMUNITY)
Admission: RE | Admit: 2023-12-11 | Discharge: 2023-12-11 | Disposition: A | Discharge status: Home / Self Care | Source: Ambulatory Visit | Attending: Obstetrics | Admitting: Obstetrics

## 2023-12-11 ENCOUNTER — Ambulatory Visit: Admitting: Obstetrics

## 2023-12-11 VITALS — BP 138/88 | HR 98 | Wt 136.8 lb

## 2023-12-11 DIAGNOSIS — Z8619 Personal history of other infectious and parasitic diseases: Secondary | ICD-10-CM | POA: Insufficient documentation

## 2023-12-11 DIAGNOSIS — N898 Other specified noninflammatory disorders of vagina: Secondary | ICD-10-CM

## 2023-12-11 DIAGNOSIS — R87610 Atypical squamous cells of undetermined significance on cytologic smear of cervix (ASC-US): Secondary | ICD-10-CM

## 2023-12-12 ENCOUNTER — Ambulatory Visit: Payer: Self-pay | Admitting: Obstetrics

## 2023-12-12 LAB — CERVICOVAGINAL ANCILLARY ONLY
Bacterial Vaginitis (gardnerella): POSITIVE — AB
Candida Glabrata: NEGATIVE
Candida Vaginitis: NEGATIVE
Chlamydia: NEGATIVE
Comment: NEGATIVE
Comment: NEGATIVE
Comment: NEGATIVE
Comment: NEGATIVE
Comment: NEGATIVE
Comment: NORMAL
Neisseria Gonorrhea: NEGATIVE
Trichomonas: POSITIVE — AB

## 2023-12-13 ENCOUNTER — Other Ambulatory Visit: Payer: Self-pay

## 2023-12-13 DIAGNOSIS — A5901 Trichomonal vulvovaginitis: Secondary | ICD-10-CM

## 2023-12-13 DIAGNOSIS — B9689 Other specified bacterial agents as the cause of diseases classified elsewhere: Secondary | ICD-10-CM

## 2023-12-13 MED ORDER — METRONIDAZOLE 500 MG PO TABS: 500.0000 mg | ORAL_TABLET | Freq: Two times a day (BID) | ORAL | 0 refills | Status: AC

## 2023-12-13 NOTE — Progress Notes (Signed)
 Metronidazole  rx'd for treatment of BV and Trich.  Called and explained results and treatment.  All questions answered.

## 2023-12-17 ENCOUNTER — Ambulatory Visit (LOCAL_COMMUNITY_HEALTH_CENTER): Payer: Self-pay

## 2023-12-17 DIAGNOSIS — Z111 Encounter for screening for respiratory tuberculosis: Secondary | ICD-10-CM

## 2023-12-19 ENCOUNTER — Other Ambulatory Visit

## 2023-12-20 ENCOUNTER — Ambulatory Visit

## 2023-12-20 DIAGNOSIS — Z111 Encounter for screening for respiratory tuberculosis: Secondary | ICD-10-CM

## 2023-12-20 LAB — TB SKIN TEST
Induration: 0 mm
TB Skin Test: NEGATIVE
